# Patient Record
Sex: Male | Born: 1965 | Race: Black or African American | Hispanic: No | Marital: Single | Smoking: Current every day smoker
Health system: Southern US, Community
[De-identification: ages and names within clinical notes are randomized; demographics above are authoritative.]

## PROBLEM LIST (undated history)

## (undated) DIAGNOSIS — I10 Essential (primary) hypertension: Secondary | ICD-10-CM

---

## 2005-08-10 ENCOUNTER — Emergency Department: Payer: Self-pay | Admitting: Emergency Medicine

## 2014-03-11 ENCOUNTER — Emergency Department: Payer: Self-pay | Admitting: Emergency Medicine

## 2016-04-19 ENCOUNTER — Encounter: Payer: Self-pay | Admitting: Urgent Care

## 2016-04-19 DIAGNOSIS — F17200 Nicotine dependence, unspecified, uncomplicated: Secondary | ICD-10-CM | POA: Insufficient documentation

## 2016-04-19 DIAGNOSIS — I10 Essential (primary) hypertension: Secondary | ICD-10-CM | POA: Insufficient documentation

## 2016-04-19 DIAGNOSIS — K2970 Gastritis, unspecified, without bleeding: Secondary | ICD-10-CM | POA: Insufficient documentation

## 2016-04-19 LAB — CBC
HEMATOCRIT: 50.3 % (ref 40.0–52.0)
Hemoglobin: 17.8 g/dL (ref 13.0–18.0)
MCH: 32.3 pg (ref 26.0–34.0)
MCHC: 35.4 g/dL (ref 32.0–36.0)
MCV: 91.3 fL (ref 80.0–100.0)
Platelets: 205 10*3/uL (ref 150–440)
RBC: 5.51 MIL/uL (ref 4.40–5.90)
RDW: 14 % (ref 11.5–14.5)
WBC: 7.7 10*3/uL (ref 3.8–10.6)

## 2016-04-19 LAB — COMPREHENSIVE METABOLIC PANEL
ALBUMIN: 5 g/dL (ref 3.5–5.0)
ALT: 20 U/L (ref 17–63)
AST: 26 U/L (ref 15–41)
Alkaline Phosphatase: 74 U/L (ref 38–126)
Anion gap: 9 (ref 5–15)
BILIRUBIN TOTAL: 0.3 mg/dL (ref 0.3–1.2)
BUN: 9 mg/dL (ref 6–20)
CO2: 25 mmol/L (ref 22–32)
Calcium: 9.4 mg/dL (ref 8.9–10.3)
Chloride: 104 mmol/L (ref 101–111)
Creatinine, Ser: 1.13 mg/dL (ref 0.61–1.24)
GFR calc Af Amer: >60 mL/min (ref >60)
GFR calc non Af Amer: >60 mL/min (ref >60)
GLUCOSE: 108 mg/dL — AB (ref 65–99)
POTASSIUM: 3.8 mmol/L (ref 3.5–5.1)
Sodium: 138 mmol/L (ref 135–145)
TOTAL PROTEIN: 8.4 g/dL — AB (ref 6.5–8.1)

## 2016-04-19 LAB — LIPASE, BLOOD: Lipase: 23 U/L (ref 11–51)

## 2016-04-19 NOTE — ED Triage Notes (Addendum)
Patient presents with c/o epigastric pain that has been going for over a month. Patient frequently consumes etoh; denies alcoholic gastritis and/or pancreatitis diagnosis. Patient reports that he has been using a lot of Pepto Bismol and "some tablets"; indicates that these measures are effective for "about 8 minutes". Of note, patient here with another family member that is also being seen in the ED; states, "Since I was here I figured that I would get it checked out".

## 2016-04-20 ENCOUNTER — Emergency Department
Admission: EM | Admit: 2016-04-20 | Discharge: 2016-04-20 | Disposition: A | Discharge status: Home / Self Care | Payer: Self-pay | Attending: Emergency Medicine | Admitting: Emergency Medicine

## 2016-04-20 ENCOUNTER — Encounter: Payer: Self-pay | Admitting: Emergency Medicine

## 2016-04-20 DIAGNOSIS — K2970 Gastritis, unspecified, without bleeding: Secondary | ICD-10-CM

## 2016-04-20 HISTORY — DX: Essential (primary) hypertension: I10

## 2016-04-20 LAB — URINALYSIS COMPLETE WITH MICROSCOPIC (ARMC ONLY)
BACTERIA UA: NONE SEEN
BILIRUBIN URINE: NEGATIVE
GLUCOSE, UA: NEGATIVE mg/dL
Hgb urine dipstick: NEGATIVE
Ketones, ur: NEGATIVE mg/dL
Leukocytes, UA: NEGATIVE
NITRITE: NEGATIVE
Protein, ur: NEGATIVE mg/dL
Specific Gravity, Urine: 1.002 — ABNORMAL LOW (ref 1.005–1.030)
pH: 6 (ref 5.0–8.0)

## 2016-04-20 MED ORDER — RANITIDINE HCL 300 MG PO TABS: 300.0000 mg | ORAL_TABLET | Freq: Every day | ORAL | 0 refills | Status: AC

## 2016-04-20 NOTE — ED Provider Notes (Signed)
[facility] Emergency Department Provider Note   First MD Initiated Contact with Patient 04/20/16 0210     (approximate)  I have reviewed the triage vital signs and the nursing notes.   HISTORY  Chief Complaint Abdominal Pain   HPI Stephanie AcreRodney Hergert is a 50 y.o. male with history of daily heavy alcohol intake presents to the emergency department with epigastric pain. Patient states he notes epigastric abdominal pain that he describes as cramping after drinking alcohol. Patient states that he's noticed this pain over the past month. Patient denies any vomiting no diarrhea no fever. Patient admits to heavy EtOH ingestion tonight. Patient denies any pain at present.   Past Medical History:  Diagnosis Date  . Hypertension     There are no active problems to display for this patient.   History reviewed. No pertinent surgical history.  Prior to Admission medications   Not on File    Allergies No known drug allergies History reviewed. No pertinent family history.  Social History Social History  Substance Use Topics  . Smoking status: Current Every Day Smoker  . Smokeless tobacco: Never Used  . Alcohol use Yes    Review of Systems Constitutional: No fever/chills Eyes: No visual changes. ENT: No sore throat. Cardiovascular: Denies chest pain. Respiratory: Denies shortness of breath. Gastrointestinal: Positive for epigastric abdominal pain. No nausea, no vomiting.  No diarrhea.  No constipation. Genitourinary: Negative for dysuria. Musculoskeletal: Negative for back pain. Skin: Negative for rash. Neurological: Negative for headaches, focal weakness or numbness.  10-point ROS otherwise negative.  ____________________________________________   PHYSICAL EXAM:  VITAL SIGNS: ED Triage Vitals  Enc Vitals Group     BP 04/19/16 2334 (!) 156/100     Pulse Rate 04/19/16 2334 95     Resp 04/19/16 2334 18     Temp 04/19/16 2334 98.8 F (37.1 C)    Temp Source 04/19/16 2334 Oral     SpO2 04/19/16 2334 100 %     Weight 04/19/16 2335 215 lb (97.5 kg)     Height 04/19/16 2335 5\' 7"  (1.702 m)     Head Circumference --      Peak Flow --      Pain Score 04/19/16 2335 5     Pain Loc --      Pain Edu? --      Excl. in GC? --     Constitutional: Alert, EtOH on breath Eyes: Conjunctivae are normal. PERRL. EOMI. Head: Atraumatic. Mouth/Throat: Mucous membranes are moist.  Oropharynx non-erythematous. Neck: No stridor.  No meningeal signs.   Cardiovascular: Normal rate, regular rhythm. Good peripheral circulation. Grossly normal heart sounds. Respiratory: Normal respiratory effort.  No retractions. Lungs CTAB. Gastrointestinal: Soft and nontender. No distention.   Musculoskeletal: No lower extremity tenderness nor edema. No gross deformities of extremities. Neurologic:  Normal speech and language. No gross focal neurologic deficits are appreciated.  Skin:  Skin is warm, dry and intact. No rash noted. Psychiatric: Mood and affect are normal. Speech and behavior are normal.  ____________________________________________   LABS (all labs ordered are listed, but only abnormal results are displayed)  Labs Reviewed  COMPREHENSIVE METABOLIC PANEL - Abnormal; Notable for the following:       Result Value   Glucose, Bld 108 (*)    Total Protein 8.4 (*)    All other components within normal limits  URINALYSIS COMPLETEWITH MICROSCOPIC ([facility] ONLY) - Abnormal; Notable for the following:    Color, Urine COLORLESS (*)  APPearance CLEAR (*)    Specific Gravity, Urine 1.002 (*)    Squamous Epithelial / LPF 0-5 (*)    All other components within normal limits  LIPASE, BLOOD  CBC   ED ECG REPORT I, [place] N BROWN, the attending physician, personally viewed and interpreted this ECG.   Date: 04/20/2016  EKG Time: 11:44 PM  Rate: 91  Rhythm: Normal sinus rhythm  Axis: Normal  Intervals: Normal  ST&T Change:  None    Procedures     INITIAL IMPRESSION / ASSESSMENT AND PLAN / ED COURSE  Pertinent labs & imaging results that were available during my care of the patient were reviewed by me and considered in my medical decision making (see chart for details).  50 year old male with history of daily heavy EtOH ingestion with epigastric abdominal and following. History of physical exam consistent with possible gastritis versus ulcer disease patient has no pain at present. Spoke with the patient at length regarding necessity of decreasing alcohol ingestion.   Clinical Course    ____________________________________________  FINAL CLINICAL IMPRESSION(S) / ED DIAGNOSES  Final diagnoses:  Gastritis     MEDICATIONS GIVEN DURING THIS VISIT:  Medications - No data to display   NEW OUTPATIENT MEDICATIONS STARTED DURING THIS VISIT:  New Prescriptions   No medications on file    Modified Medications   No medications on file    Discontinued Medications   No medications on file     Note:  This document was prepared using Dragon voice recognition software and may include unintentional dictation errors.    Darci Current, MD 04/20/16 [phone]

## 2017-08-08 ENCOUNTER — Encounter: Payer: Self-pay | Admitting: Emergency Medicine

## 2017-08-08 ENCOUNTER — Emergency Department
Admission: EM | Admit: 2017-08-08 | Discharge: 2017-08-08 | Disposition: A | Discharge status: Home / Self Care | Payer: Self-pay | Attending: Emergency Medicine | Admitting: Emergency Medicine

## 2017-08-08 DIAGNOSIS — Z79899 Other long term (current) drug therapy: Secondary | ICD-10-CM | POA: Insufficient documentation

## 2017-08-08 DIAGNOSIS — F17210 Nicotine dependence, cigarettes, uncomplicated: Secondary | ICD-10-CM | POA: Insufficient documentation

## 2017-08-08 DIAGNOSIS — R1013 Epigastric pain: Secondary | ICD-10-CM

## 2017-08-08 DIAGNOSIS — K2900 Acute gastritis without bleeding: Secondary | ICD-10-CM | POA: Insufficient documentation

## 2017-08-08 DIAGNOSIS — I10 Essential (primary) hypertension: Secondary | ICD-10-CM | POA: Insufficient documentation

## 2017-08-08 LAB — COMPREHENSIVE METABOLIC PANEL
ALBUMIN: 4.5 g/dL (ref 3.5–5.0)
ALK PHOS: 84 U/L (ref 38–126)
ALT: <5 U/L — ABNORMAL LOW (ref 17–63)
ANION GAP: 7 (ref 5–15)
AST: 30 U/L (ref 15–41)
BUN: 11 mg/dL (ref 6–20)
CALCIUM: 9.4 mg/dL (ref 8.9–10.3)
CO2: 30 mmol/L (ref 22–32)
Chloride: 102 mmol/L (ref 101–111)
Creatinine, Ser: 1.31 mg/dL — ABNORMAL HIGH (ref 0.61–1.24)
GFR calc Af Amer: >60 mL/min (ref >60)
GFR calc non Af Amer: >60 mL/min (ref >60)
GLUCOSE: 95 mg/dL (ref 65–99)
Potassium: 4.1 mmol/L (ref 3.5–5.1)
SODIUM: 139 mmol/L (ref 135–145)
Total Bilirubin: 1.1 mg/dL (ref 0.3–1.2)
Total Protein: 7.7 g/dL (ref 6.5–8.1)

## 2017-08-08 LAB — CBC
HCT: 48.5 % (ref 40.0–52.0)
HEMOGLOBIN: 16.6 g/dL (ref 13.0–18.0)
MCH: 32 pg (ref 26.0–34.0)
MCHC: 34.3 g/dL (ref 32.0–36.0)
MCV: 93.1 fL (ref 80.0–100.0)
Platelets: 236 10*3/uL (ref 150–440)
RBC: 5.2 MIL/uL (ref 4.40–5.90)
RDW: 13.2 % (ref 11.5–14.5)
WBC: 6.3 10*3/uL (ref 3.8–10.6)

## 2017-08-08 LAB — LIPASE, BLOOD: Lipase: 31 U/L (ref 11–51)

## 2017-08-08 MED ORDER — FAMOTIDINE 20 MG PO TABS: 20.0000 mg | ORAL_TABLET | Freq: Two times a day (BID) | ORAL | 0 refills | Status: AC

## 2017-08-08 MED ORDER — GI COCKTAIL ~~LOC~~
30.0000 mL | ORAL | Status: AC
  Administered 2017-08-08: 30 mL via ORAL
  Filled 2017-08-08: qty 30

## 2017-08-08 MED ORDER — AMLODIPINE BESYLATE 5 MG PO TABS
5.0000 mg | ORAL_TABLET | Freq: Once | ORAL | Status: AC
  Administered 2017-08-08: 5 mg via ORAL
  Filled 2017-08-08: qty 1

## 2017-08-08 MED ORDER — FAMOTIDINE 20 MG PO TABS
40.0000 mg | ORAL_TABLET | Freq: Once | ORAL | Status: AC
  Administered 2017-08-08: 40 mg via ORAL
  Filled 2017-08-08: qty 2

## 2017-08-08 MED ORDER — AMLODIPINE BESYLATE 5 MG PO TABS: 5.0000 mg | ORAL_TABLET | Freq: Every day | ORAL | 1 refills | Status: AC

## 2017-08-08 MED ORDER — ALUMINUM-MAGNESIUM-SIMETHICONE 200-200-20 MG/5ML PO SUSP: 30.0000 mL | Freq: Three times a day (TID) | ORAL | 0 refills | Status: AC

## 2017-08-08 NOTE — ED Notes (Signed)
Pt given cup of water to drink. 

## 2017-08-08 NOTE — ED Triage Notes (Signed)
Patient presents to ED via POV from home with c/o abdominal pain. Patient denies N/V/D. Patient reports sharp pain to epigastric area.

## 2017-08-08 NOTE — ED Provider Notes (Signed)
[facility] Emergency Department Provider Note  ____________________________________________  Time seen: Approximately 6:56 PM  I have reviewed the triage vital signs and the nursing notes.   HISTORY  Chief Complaint Abdominal Pain    HPI Glenn Wong is a 52 y.o. male who complains of sharp epigastric pain for the past month, intermittent, no aggravating or alleviating factors. Not exertional or pleuritic, not related to eating. Nonradiating. Moderate intensity. Last for a few minutes at a time when it comes on. Tried Advil and Tums without relief.     Past Medical History:  Diagnosis Date  . Hypertension      There are no active problems to display for this patient.    History reviewed. No pertinent surgical history.   Prior to Admission medications   Medication Sig Start Date End Date Taking? Authorizing Provider  aluminum-magnesium hydroxide-simethicone (MAALOX) 200-200-20 MG/5ML SUSP Take 30 mLs by mouth 4 (four) times daily -  before meals and at bedtime. 08/08/17   Sharman CheekStafford, Jerrad Mendibles, MD  amLODipine (NORVASC) 5 MG tablet Take 1 tablet (5 mg total) by mouth daily. 08/08/17   Sharman CheekStafford, Messina Kosinski, MD  famotidine (PEPCID) 20 MG tablet Take 1 tablet (20 mg total) by mouth 2 (two) times daily. 08/08/17   Sharman CheekStafford, Eupha Lobb, MD  ranitidine (ZANTAC) 300 MG tablet Take 1 tablet (300 mg total) by mouth at bedtime. 04/20/16 04/20/17  Darci CurrentBrown, [place] N, MD     Allergies Patient has no known allergies.   No family history on file.  Social History Social History   Tobacco Use  . Smoking status: Current Every Day Smoker    Packs/day: 0.50    Types: Cigarettes  . Smokeless tobacco: Never Used  Substance Use Topics  . Alcohol use: Yes  . Drug use: No    Review of Systems  Constitutional:   No fever or chills.  ENT:   No sore throat. No rhinorrhea. Cardiovascular:   No chest pain or syncope. Respiratory:   No dyspnea or cough. Gastrointestinal:    Positive as above for abdominal pain without vomiting and diarrhea.  Musculoskeletal:   Negative for focal pain or swelling All other systems reviewed and are negative except as documented above in ROS and HPI.  ____________________________________________   PHYSICAL EXAM:  VITAL SIGNS: ED Triage Vitals [08/08/17 1449]  Enc Vitals Group     BP (!) 170/113     Pulse Rate 94     Resp 17     Temp 98.6 F (37 C)     Temp src      SpO2 100 %     Weight 220 lb (99.8 kg)     Height 5\' 7"  (1.702 m)     Head Circumference      Peak Flow      Pain Score 0     Pain Loc      Pain Edu?      Excl. in GC?     Vital signs reviewed, nursing assessments reviewed.   Constitutional:   Alert and oriented. Well appearing and in no distress. Eyes:   No scleral icterus.  EOMI. No nystagmus. No conjunctival pallor. PERRL. ENT   Head:   Normocephalic and atraumatic.   Nose:   No congestion/rhinnorhea.    Mouth/Throat:   MMM, no pharyngeal erythema. No peritonsillar mass.    Neck:   No meningismus. Full ROM. Hematological/Lymphatic/Immunilogical:   No cervical lymphadenopathy. Cardiovascular:   RRR. Symmetric bilateral radial and DP pulses.  No  murmurs.  Respiratory:   Normal respiratory effort without tachypnea/retractions. Breath sounds are clear and equal bilaterally. No wheezes/rales/rhonchi. Gastrointestinal:   Soft with mild epigastric and left upper quadrant tenderness. Non distended. There is no CVA tenderness.  No rebound, rigidity, or guarding. Genitourinary:   deferred Musculoskeletal:   Normal range of motion in all extremities. No joint effusions.  No lower extremity tenderness.  No edema. Neurologic:   Normal speech and language.  Motor grossly intact. No gross focal neurologic deficits are appreciated.  Skin:    Skin is warm, dry and intact. No rash noted.  No petechiae, purpura, or bullae.  ____________________________________________    LABS (pertinent  positives/negatives) (all labs ordered are listed, but only abnormal results are displayed) Labs Reviewed  COMPREHENSIVE METABOLIC PANEL - Abnormal; Notable for the following components:      Result Value   Creatinine, Ser 1.31 (*)    ALT <5 (*)    All other components within normal limits  LIPASE, BLOOD  CBC  URINALYSIS, COMPLETE (UACMP) WITH MICROSCOPIC   ____________________________________________   EKG    ____________________________________________    RADIOLOGY  No results found.  ____________________________________________   PROCEDURES Procedures  ____________________________________________    CLINICAL IMPRESSION / ASSESSMENT AND PLAN / ED COURSE  Pertinent labs & imaging results that were available during my care of the patient were reviewed by me and considered in my medical decision making (see chart for details).   Patient well-appearing no acute distress, reassuring exam suggestive of gastritis and GERD. Vital signs unremarkable except for her severe hypertension without any evidence of end organ dysfunction. Low suspicion for dissection or AAA.Considering the patient's symptoms, medical history, and physical examination today, I have low suspicion for cholecystitis or biliary pathology, pancreatitis, perforation or bowel obstruction, hernia, intra-abdominal abscess, AAA or dissection, volvulus or intussusception, mesenteric ischemia, or appendicitis.  We'll start treatment with antacids, H2 blocker. Follow up with primary care. May need referral to gastroenterology if symptoms do not improve within the next 1-2 weeks. Patient counseled on all this, because our return precautions.      ____________________________________________   FINAL CLINICAL IMPRESSION(S) / ED DIAGNOSES    Final diagnoses:  Epigastric pain  Acute gastritis without hemorrhage, unspecified gastritis type  Hypertension, unspecified type       Portions of this note were  generated with dragon dictation software. Dictation errors may occur despite best attempts at proofreading.    Sharman Cheek, MD 08/08/17 [phone]

## 2017-08-13 ENCOUNTER — Emergency Department: Payer: Self-pay

## 2017-08-13 ENCOUNTER — Encounter: Payer: Self-pay | Admitting: Emergency Medicine

## 2017-08-13 ENCOUNTER — Emergency Department
Admission: EM | Admit: 2017-08-13 | Discharge: 2017-08-13 | Disposition: A | Discharge status: Home / Self Care | Payer: Self-pay | Attending: Emergency Medicine | Admitting: Emergency Medicine

## 2017-08-13 DIAGNOSIS — S60222A Contusion of left hand, initial encounter: Secondary | ICD-10-CM | POA: Insufficient documentation

## 2017-08-13 DIAGNOSIS — Y92142 Bathroom in prison as the place of occurrence of the external cause: Secondary | ICD-10-CM | POA: Insufficient documentation

## 2017-08-13 DIAGNOSIS — Y9389 Activity, other specified: Secondary | ICD-10-CM | POA: Insufficient documentation

## 2017-08-13 DIAGNOSIS — Y990 Civilian activity done for income or pay: Secondary | ICD-10-CM | POA: Insufficient documentation

## 2017-08-13 DIAGNOSIS — S61432A Puncture wound without foreign body of left hand, initial encounter: Secondary | ICD-10-CM | POA: Insufficient documentation

## 2017-08-13 DIAGNOSIS — F17210 Nicotine dependence, cigarettes, uncomplicated: Secondary | ICD-10-CM | POA: Insufficient documentation

## 2017-08-13 DIAGNOSIS — W01118A Fall on same level from slipping, tripping and stumbling with subsequent striking against other sharp object, initial encounter: Secondary | ICD-10-CM | POA: Insufficient documentation

## 2017-08-13 DIAGNOSIS — I10 Essential (primary) hypertension: Secondary | ICD-10-CM | POA: Insufficient documentation

## 2017-08-13 DIAGNOSIS — Z23 Encounter for immunization: Secondary | ICD-10-CM | POA: Insufficient documentation

## 2017-08-13 DIAGNOSIS — Z79899 Other long term (current) drug therapy: Secondary | ICD-10-CM | POA: Insufficient documentation

## 2017-08-13 MED ORDER — NAPROXEN 500 MG PO TABS: 500.0000 mg | ORAL_TABLET | Freq: Two times a day (BID) | ORAL | 1 refills | Status: AC

## 2017-08-13 MED ORDER — SULFAMETHOXAZOLE-TRIMETHOPRIM 800-160 MG PO TABS
1.0000 | ORAL_TABLET | Freq: Once | ORAL | Status: AC
  Administered 2017-08-13: 1 via ORAL
  Filled 2017-08-13: qty 1

## 2017-08-13 MED ORDER — TETANUS-DIPHTH-ACELL PERTUSSIS 5-2.5-18.5 LF-MCG/0.5 IM SUSP
0.5000 mL | Freq: Once | INTRAMUSCULAR | Status: AC
  Administered 2017-08-13: 0.5 mL via INTRAMUSCULAR
  Filled 2017-08-13: qty 0.5

## 2017-08-13 MED ORDER — SULFAMETHOXAZOLE-TRIMETHOPRIM 800-160 MG PO TABS: 1.0000 | ORAL_TABLET | Freq: Two times a day (BID) | ORAL | 0 refills | Status: AC

## 2017-08-13 NOTE — Discharge Instructions (Signed)
Your exam and x-ray are negative for fracture or dislocation. Keep the wound clean, dry, and covered. Wash daily with soap & water only. Take the antibiotic as directed. [facility], or your provider as needed.

## 2017-08-13 NOTE — ED Provider Notes (Signed)
[facility] Emergency Department Provider Note ____________________________________________  Time seen: 1451  I have reviewed the triage vital signs and the nursing notes.  HISTORY  Chief Complaint  Hand Injury  HPI Glenn Wong is a 52 y.o. male presents to the ED, dropped off by his coworker, from a work-related injury. The patient was working with Administratorungloved hands, on a Designer, fashion/clothingbathroom demo project. He describes the floorboards around the recently removed toilet, were rotted. He apparently lost his balance as the floor gave-way, and landed with his hand on the exposed toilet anchor bolt. He presents now with an open wound to the palm of the left hand. He reports he accidentally his his left hand with a hammer on the job, yesterday. He is requesting a tetanus booster. He denies any other injury at this time.   Past Medical History:  Diagnosis Date  . Hypertension     There are no active problems to display for this patient.   History reviewed. No pertinent surgical history.  Prior to Admission medications   Medication Sig Start Date End Date Taking? Authorizing Provider  aluminum-magnesium hydroxide-simethicone (MAALOX) 200-200-20 MG/5ML SUSP Take 30 mLs by mouth 4 (four) times daily -  before meals and at bedtime. 08/08/17   Sharman CheekStafford, Phillip, MD  amLODipine (NORVASC) 5 MG tablet Take 1 tablet (5 mg total) by mouth daily. 08/08/17   Sharman CheekStafford, Phillip, MD  famotidine (PEPCID) 20 MG tablet Take 1 tablet (20 mg total) by mouth 2 (two) times daily. 08/08/17   Sharman CheekStafford, Phillip, MD  naproxen (NAPROSYN) 500 MG tablet Take 1 tablet (500 mg total) by mouth 2 (two) times daily with a meal. 08/13/17 09/12/17  Dandria Griego, Charlesetta IvoryJenise V Bacon, PA-C  ranitidine (ZANTAC) 300 MG tablet Take 1 tablet (300 mg total) by mouth at bedtime. 04/20/16 04/20/17  Darci CurrentBrown, [place] N, MD  sulfamethoxazole-trimethoprim (BACTRIM DS,SEPTRA DS) 800-160 MG tablet Take 1 tablet by mouth 2 (two) times daily. 08/13/17    Jahmiya Guidotti, Charlesetta IvoryJenise V Bacon, PA-C    Allergies Patient has no known allergies.  No family history on file.  Social History Social History   Tobacco Use  . Smoking status: Current Every Day Smoker    Packs/day: 0.50    Types: Cigarettes  . Smokeless tobacco: Never Used  Substance Use Topics  . Alcohol use: Yes  . Drug use: No    Review of Systems  Constitutional: Negative for fever. Cardiovascular: Negative for chest pain. Respiratory: Negative for shortness of breath. Musculoskeletal: Negative for back pain. Left hand pain as above Skin: Negative for rash. Neurological: Negative for headaches, focal weakness or numbness. ____________________________________________  PHYSICAL EXAM:  VITAL SIGNS: ED Triage Vitals  Enc Vitals Group     BP 08/13/17 1411 (!) 180/97     Pulse Rate 08/13/17 1411 (!) 104     Resp 08/13/17 1411 18     Temp 08/13/17 1411 99 F (37.2 C)     Temp Source 08/13/17 1411 Oral     SpO2 08/13/17 1411 99 %     Weight 08/13/17 1412 220 lb (99.8 kg)     Height 08/13/17 1412 5\' 7"  (1.702 m)     Head Circumference --      Peak Flow --      Pain Score 08/13/17 1411 7     Pain Loc --      Pain Edu? --      Excl. in GC? --     Constitutional: Alert and oriented. Well appearing and  in no distress. Head: Normocephalic and atraumatic. Cardiovascular: Normal rate, regular rhythm. Normal distal pulses. Respiratory: Normal respiratory effort. No wheezes/rales/rhonchi. Musculoskeletal: Left hand without obvious deformity. Dorsal STS noted without erythema, induration, or abrasion. Palmar aspect open wound at the base of the thenar prominence. No active bleeding, subcutaneous fat exposed. Normal composite fist. Nontender with normal range of motion in all other extremities.  Neurologic:  Normal gross sensation. Normal intrinsic & opposition testing. Normal gross sensation. No gross focal neurologic deficits are appreciated. Skin:  Skin is warm, dry and intact.  No rash noted. ____________________________________________   RADIOLOGY  Left Hand  IMPRESSION: No acute osseous injury of the left hand.  I, Allin Frix, Charlesetta Ivory, personally viewed and evaluated these images (plain radiographs) as part of my medical decision making, as well as reviewing the written report by the radiologist. ___________________________________________  PROCEDURES  Tdap 0.5 ml IM Bactrim DS 1 PO  .Marland KitchenLaceration Repair Date/Time: 08/13/2017 4:52 PM Performed by: Lissa Hoard, PA-C Authorized by: Lissa Hoard, PA-C   Consent:    Consent obtained:  Verbal   Consent given by:  Patient   Risks discussed:  Infection Anesthesia (see MAR for exact dosages):    Anesthesia method:  None Laceration details:    Location:  Hand   Hand location:  L palm   Length (cm):  2 Treatment:    Area cleansed with:  Soap and water   Amount of cleaning:  Standard   Visualized foreign bodies/material removed: yes   Skin repair:    Repair method: No skin repair to open palmar wound. Post-procedure details:    Dressing:  Non-adherent dressing, antibiotic ointment, sterile dressing and tube gauze   Patient tolerance of procedure:  Tolerated well, no immediate complications  ____________________________________________  INITIAL IMPRESSION / ASSESSMENT AND PLAN / ED COURSE  Patient with ED evaluation of work-related injury. He sustained a puncture wound to the left palm. X-ray is reassuring as it shows not retained foreign body or bony injury. His wound is a superficial puncture exposing subcutaneous fat, without tendon injury. He is appropriately prophylaxed against tetanus. He is also placed on Bactrim for MRSA coverage. His hand is washed and dressed. A work note is provided for today.  ____________________________________________  FINAL CLINICAL IMPRESSION(S) / ED DIAGNOSES  Final diagnoses:  Contusion of left hand, initial encounter  Puncture wound of  left hand without foreign body, initial encounter     Lissa Hoard, PA-C 08/13/17 1658    Sharman Cheek, MD 08/18/17 1525

## 2017-08-13 NOTE — ED Triage Notes (Signed)
Pt comes into the ED via POV c/o hand injury to the left hand.  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  He then fell back and his hand landed on the back screws for the toilet.  The patient presents with a puncture wound to the left hand and swelling on the top of the left hand from where he hit it with a hammer yesterday.  Patient in NAD at this time and all bleeding is under control.

## 2017-08-13 NOTE — ED Notes (Signed)
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

## 2018-05-14 IMAGING — DX DG HAND COMPLETE 3+V*L*
3 series · 3 of 3 positions shown · non-contrast
Comparison: None.

CLINICAL DATA: Contusion along the dorsal aspect of the hand.

EXAM:
LEFT HAND - COMPLETE 3+ VIEW

[hand ap]
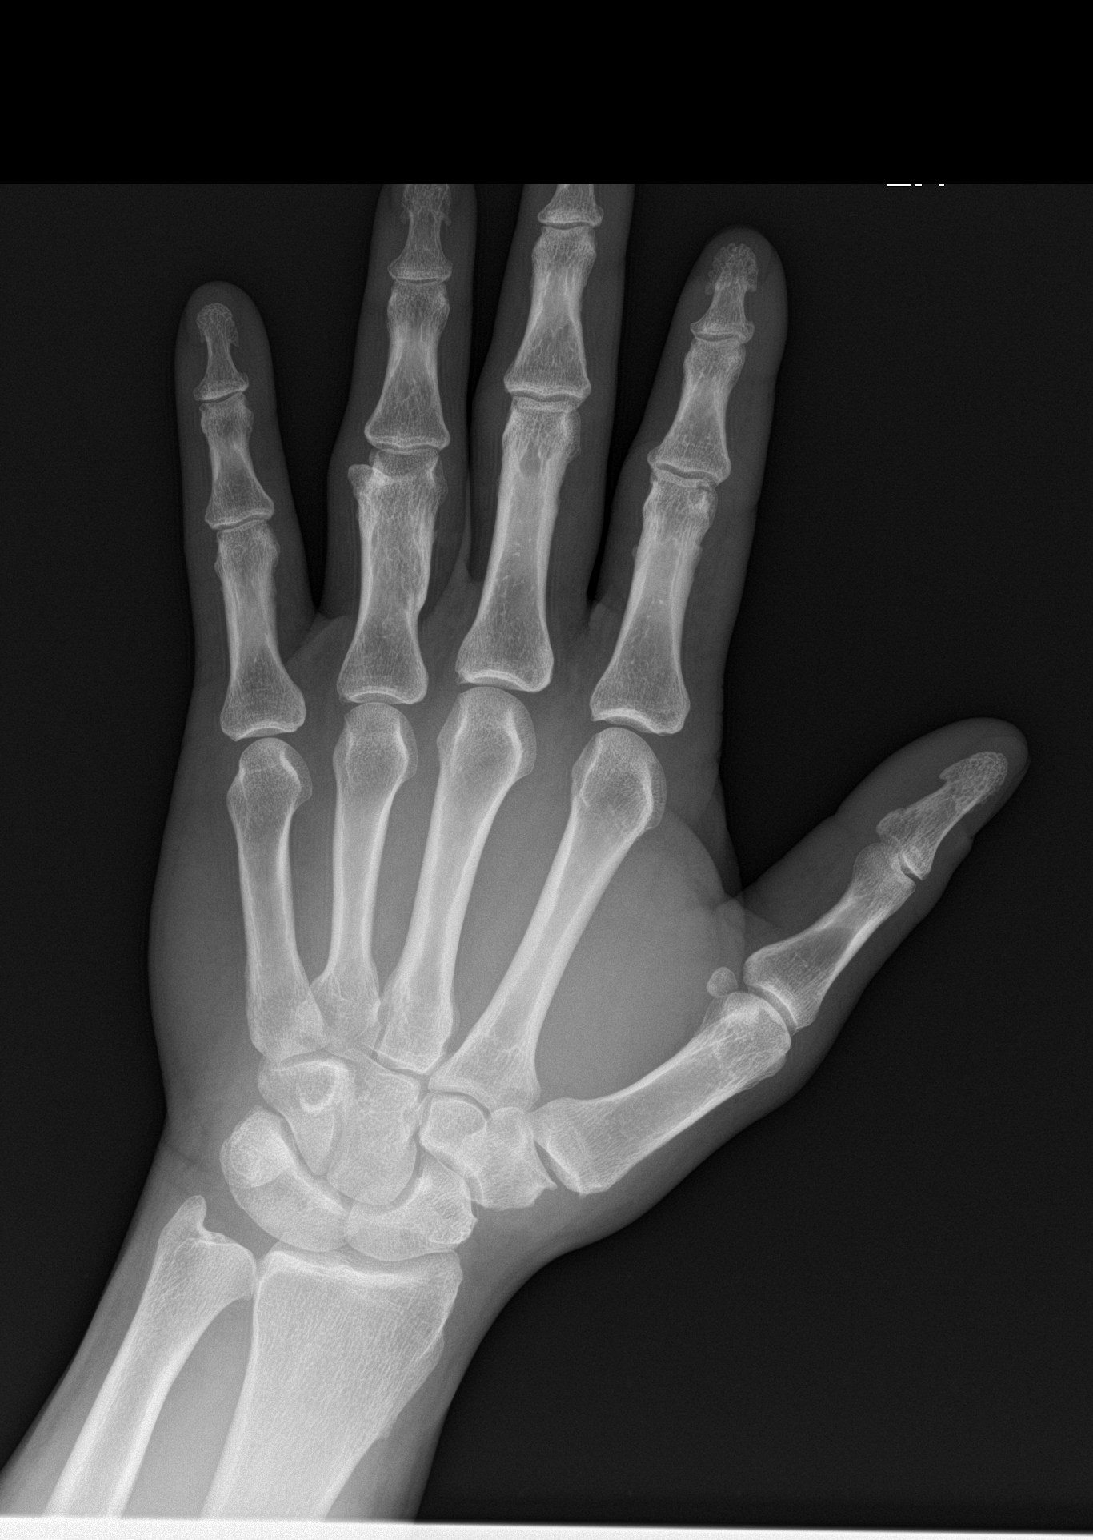

[hand obl]
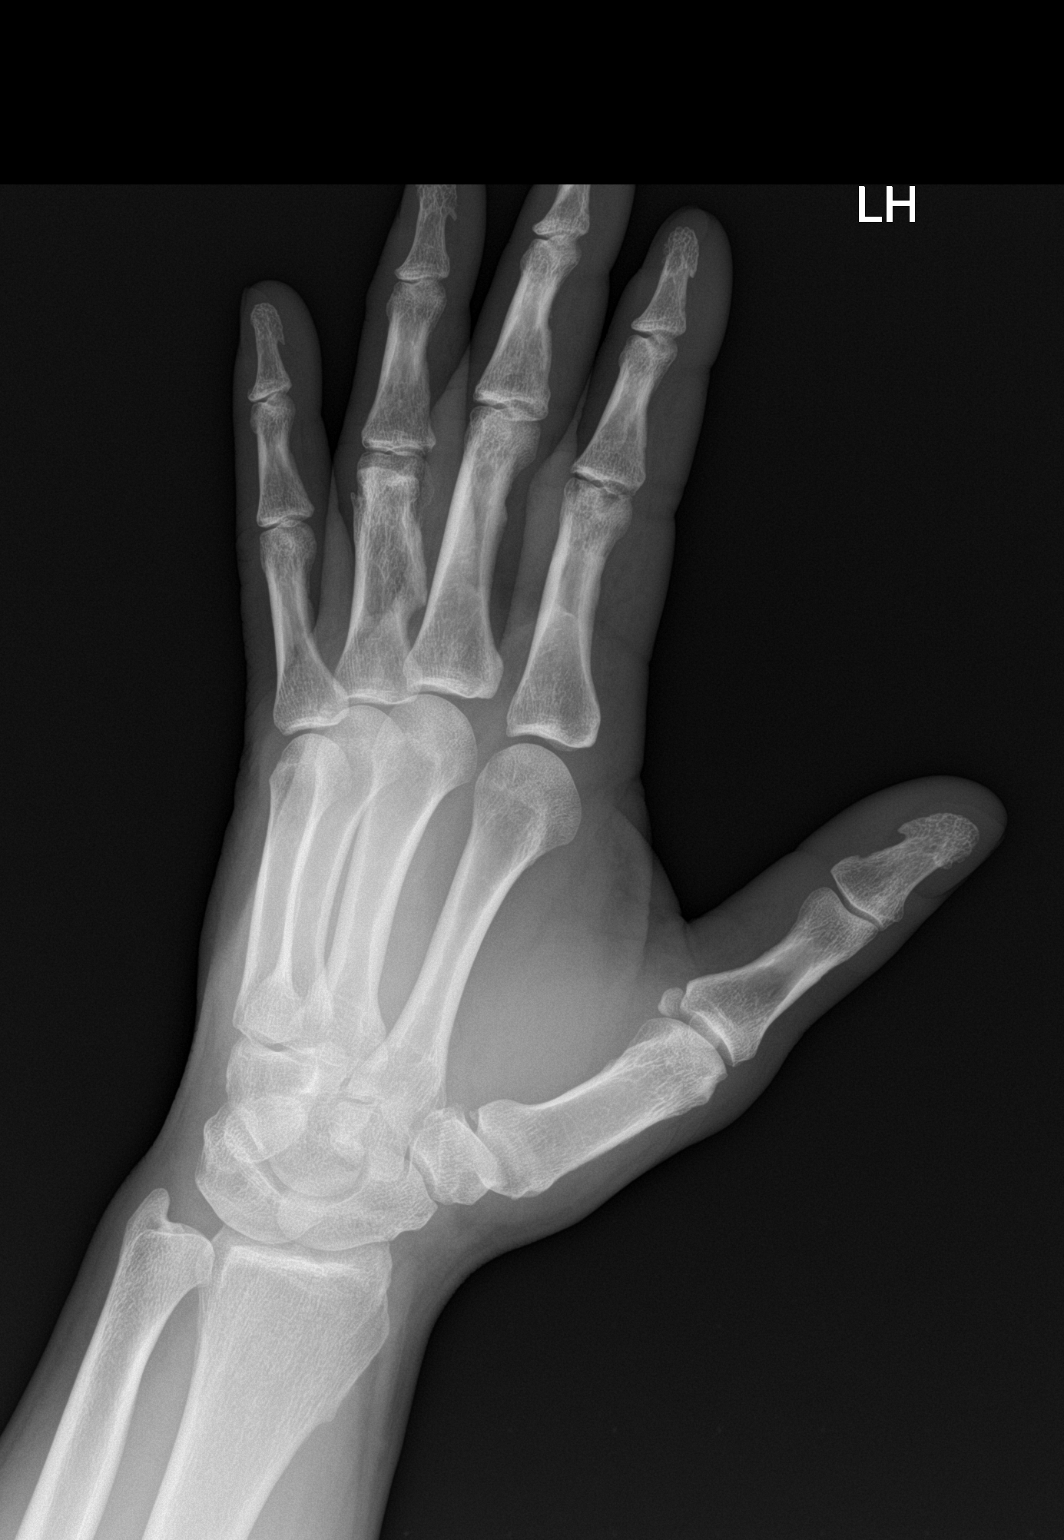

[hand lat]
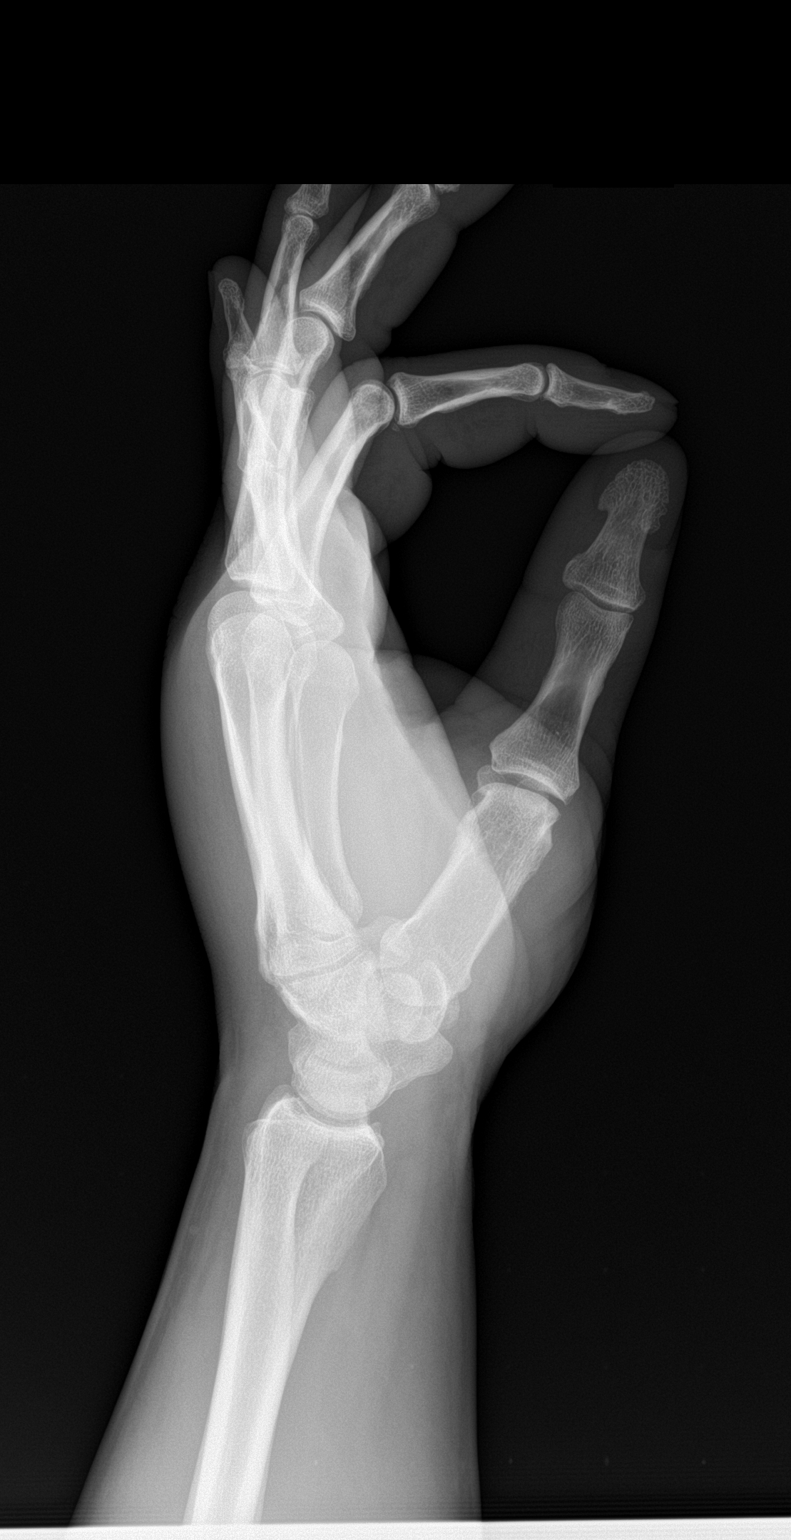

[3 of 3 positions shown; findings below may reference images not displayed]

FINDINGS: There is no evidence of fracture or dislocation. There is old
posttraumatic deformity of the fourth proximal phalanx. There is
incidental note made of a lunotriquetral coalition. There is mild
osteoarthritis of the first CMC joint. There is soft tissue swelling
along the dorsal aspect of the hand..
IMPRESSION: No acute osseous injury of the left hand.

## 2020-07-28 DEATH — deceased
